# Patient Record
Sex: Male | Born: 2005 | Race: White | Hispanic: No | Marital: Single | Smoking: Never smoker
Health system: Southern US, Community
[De-identification: ages and names within clinical notes are randomized; demographics above are authoritative.]

## PROBLEM LIST (undated history)

## (undated) DIAGNOSIS — H6690 Otitis media, unspecified, unspecified ear: Secondary | ICD-10-CM

## (undated) DIAGNOSIS — B2790 Infectious mononucleosis, unspecified without complication: Secondary | ICD-10-CM

## (undated) HISTORY — PX: CIRCUMCISION REVISION: SHX1347

---
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
## 2024-02-15 ENCOUNTER — Ambulatory Visit
Admission: EM | Admit: 2024-02-15 | Discharge: 2024-02-15 | Disposition: A | Discharge status: Home / Self Care | Attending: Nurse Practitioner | Admitting: Nurse Practitioner

## 2024-02-15 DIAGNOSIS — J069 Acute upper respiratory infection, unspecified: Secondary | ICD-10-CM | POA: Diagnosis not present

## 2024-02-15 DIAGNOSIS — R059 Cough, unspecified: Secondary | ICD-10-CM

## 2024-02-15 MED ORDER — PSEUDOEPH-BROMPHEN-DM 30-2-10 MG/5ML PO SYRP: 5.0000 mL | ORAL_SOLUTION | Freq: Four times a day (QID) | ORAL | 0 refills | Status: DC | PRN

## 2024-02-15 MED ORDER — AZITHROMYCIN 250 MG PO TABS: 250.0000 mg | ORAL_TABLET | Freq: Every day | ORAL | 0 refills | Status: DC

## 2024-02-15 NOTE — ED Triage Notes (Signed)
 Pt reports he has a cough, sore throat, wheezing , and SHOB  x 2 weeks   Used inhaler which gave slight relief

## 2024-02-15 NOTE — ED Provider Notes (Signed)
 [facility]    CSN: [id number] Arrival date & time: 02/15/24  7829      History   Chief Complaint No chief complaint on file.   HPI Christopher Holloway is a 18 y.o. male.   The history is provided by the patient.   Patient presents with a 2-week history of cough, sore throat, wheezing, and shortness of breath.  Patient denies fever, chills, ear pain, difficulty breathing, chest pain, abdominal pain, nausea, vomiting, diarrhea, or rash.  Patient denies history of asthma or seasonal allergies, but states that he has been using an inhaler that was given to him by his mother.  He also states he has been taking over-the-counter antihistamines and cough medication with minimal relief of his symptoms.  Past Medical History:  Diagnosis Date   Mononucleosis    Otitis media    current ear infection, will finish antibiotic 10/04/11    There are no active problems to display for this patient.   Past Surgical History:  Procedure Laterality Date   ADENOIDECTOMY W/ MYRINGOTOMY  06/2007   CIRCUMCISION REVISION  age 44 yr.   MYRINGOTOMY  10/06/2011   Procedure: MYRINGOTOMY;  Surgeon: Carolan Shiver, MD;  Location: [facility];  Service: ENT;  Laterality: Bilateral;       Home Medications    Prior to Admission medications   Medication Sig Start Date End Date Taking? Authorizing Provider  albuterol (VENTOLIN HFA) 108 (90 Base) MCG/ACT inhaler INHALE 2 PUFFS BY MOUTH EVERY FOUR HOURS AS NEEDED 02/14/21 02/14/22  Bonnye Fava, FNP  albuterol (VENTOLIN HFA) 108 (90 Base) MCG/ACT inhaler Inhale 2 puffs by mouth every 4-6 hours as needed. 10/16/22     Clindamycin-Benzoyl Per, Refr, gel Apply topically to face daily at bedtime 08/22/21   Nita Sells, MD  doxycycline (VIBRAMYCIN) 100 MG capsule Take 1 capsule (100 mg total) by mouth 2 (two) times daily with food and water. 08/22/21   Nita Sells, MD  HYDROcodone-acetaminophen (NORCO/VICODIN) 5-325 MG tablet Take 1 tablet BY  MOUTH every 4-6 hours as needed for pain for 5 days. Patient not taking: Reported on 11/07/2021 03/21/21     ondansetron (ZOFRAN) 4 MG tablet Take 1 tablet (4 mg total) by mouth 3 (three) times daily as needed. 10/16/22     polyethylene glycol (MIRALAX / GLYCOLAX) packet Take 17 g by mouth daily.    [provider]    Family History Family History  Problem Relation Age of Onset   Asthma Sister        3 sisters have asthma   Asthma Sister    Asthma Sister    Diabetes Maternal Grandmother    Diabetes Paternal Grandfather    Hepatitis Paternal Grandfather        Hep. C   Anesthesia problems Mother        post-op N/V   Anesthesia problems Father        post-op N/V    Social History Social History   Tobacco Use   Smoking status: Never   Smokeless tobacco: Never     Allergies   Patient has no known allergies.   Review of Systems Review of Systems Per HPI  Physical Exam Triage Vital Signs ED Triage Vitals  Encounter Vitals Group     BP 02/15/24 0949 90/63     Systolic BP Percentile --      Diastolic BP Percentile --      Pulse Rate 02/15/24 0949 65  Resp 02/15/24 0949 16     Temp 02/15/24 0949 98.1 F (36.7 C)     Temp Source 02/15/24 0949 Oral     SpO2 02/15/24 0949 97 %     Weight --      Height --      Head Circumference --      Peak Flow --      Pain Score 02/15/24 0950 0     Pain Loc --      Pain Education --      Exclude from Growth Chart --    No data found.  Updated Vital Signs BP 90/63 (BP Location: Right Arm)   Pulse 65   Temp 98.1 F (36.7 C) (Oral)   Resp 16   SpO2 97%   Visual Acuity Right Eye Distance:   Left Eye Distance:   Bilateral Distance:    Right Eye Near:   Left Eye Near:    Bilateral Near:     Physical Exam Vitals and nursing note reviewed.  Constitutional:      General: He is not in acute distress.    Appearance: Normal appearance.  HENT:     Head: Normocephalic.     Right Ear: Tympanic membrane, ear  canal and external ear normal.     Left Ear: Tympanic membrane, ear canal and external ear normal.     Nose: Congestion present.     Mouth/Throat:     Lips: Pink.     Mouth: Mucous membranes are moist.     Pharynx: Posterior oropharyngeal erythema and postnasal drip present. No pharyngeal swelling, oropharyngeal exudate or uvula swelling.     Comments: Cobblestoning present to posterior oropharynx  Eyes:     General: Lids are normal.     Extraocular Movements: Extraocular movements intact.     Conjunctiva/sclera: Conjunctivae normal.     Pupils: Pupils are equal, round, and reactive to light.  Cardiovascular:     Rate and Rhythm: Normal rate and regular rhythm.     Pulses: Normal pulses.     Heart sounds: Normal heart sounds.  Pulmonary:     Effort: Pulmonary effort is normal. No respiratory distress.     Breath sounds: Normal breath sounds. No stridor. No wheezing, rhonchi or rales.  Abdominal:     General: Bowel sounds are normal.     Palpations: Abdomen is soft.     Tenderness: There is no abdominal tenderness.  Musculoskeletal:     Cervical back: Normal range of motion.  Lymphadenopathy:     Cervical: No cervical adenopathy.  Skin:    General: Skin is warm and dry.  Neurological:     General: No focal deficit present.     Mental Status: He is alert and oriented to person, place, and time.  Psychiatric:        Mood and Affect: Mood normal.        Behavior: Behavior normal.      UC Treatments / Results  Labs (all labs ordered are listed, but only abnormal results are displayed) Labs Reviewed - No data to display  EKG   Radiology No results found.  Procedures Procedures (including critical care time)  Medications Ordered in UC Medications - No data to display  Initial Impression / Assessment and Plan / UC Course  I have reviewed the triage vital signs and the nursing notes.  Pertinent labs & imaging results that were available during my care of the patient  were reviewed by me and  considered in my medical decision making (see chart for details).  On exam, lung sounds are clear throughout, room air sats at 97%.  Given the ongoing cough and chest congestion, will treat with azithromycin 250 mg.  For his cough, Bromfed-DM was prescribed.  Patient advised to continue over-the-counter antihistamines and use of his albuterol inhaler as needed.  Supportive care recommendations were provided and discussed with the patient to include fluids, rest, over-the-counter analgesics, and use of a humidifier during sleep.  Discussed indications with patient regarding follow-up.  Patient was in agreement with this plan of care and verbalizes understanding.  All questions were answered.  Patient stable for discharge.   Final Clinical Impressions(s) / UC Diagnoses   Final diagnoses:  None   Discharge Instructions   None    ED Prescriptions   None    PDMP not reviewed this encounter.   Abran Cantor, NP 02/15/24 1029

## 2024-02-15 NOTE — Discharge Instructions (Signed)
 Take medication as prescribed. Increase fluids and allow for plenty of rest. Continue use of your albuterol inhaler and current allergy medications at this time. May take over-the-counter Tylenol as needed for pain, fever, or general discomfort. Also recommend the use of a humidifier in your bedroom at nighttime during sleep and sleeping elevated on pillows while symptoms persist. Warm salt water gargles 3-4 times daily while symptoms persist. As discussed, if symptoms fail to improve, you may follow-up in this clinic or with your primary care physician for further evaluation. Follow-up as needed.

## 2024-10-15 ENCOUNTER — Other Ambulatory Visit: Payer: Self-pay

## 2024-10-15 ENCOUNTER — Telehealth: Admitting: Physician Assistant

## 2024-10-15 DIAGNOSIS — B9689 Other specified bacterial agents as the cause of diseases classified elsewhere: Secondary | ICD-10-CM | POA: Diagnosis not present

## 2024-10-15 DIAGNOSIS — J069 Acute upper respiratory infection, unspecified: Secondary | ICD-10-CM | POA: Diagnosis not present

## 2024-10-15 MED ORDER — AMOXICILLIN-POT CLAVULANATE 875-125 MG PO TABS
1.0000 | ORAL_TABLET | Freq: Two times a day (BID) | ORAL | 0 refills | Status: AC
  Filled 2024-10-15: qty 14, 7d supply, fill #0

## 2024-10-15 MED ORDER — BENZONATATE 100 MG PO CAPS
100.0000 mg | ORAL_CAPSULE | Freq: Three times a day (TID) | ORAL | 0 refills | Status: AC | PRN
  Filled 2024-10-15: qty 30, 10d supply, fill #0

## 2024-10-15 MED ORDER — ALBUTEROL SULFATE HFA 108 (90 BASE) MCG/ACT IN AERS
2.0000 | INHALATION_SPRAY | Freq: Four times a day (QID) | RESPIRATORY_TRACT | 0 refills | Status: AC | PRN
  Filled 2024-10-15: qty 8.5, 30d supply, fill #0

## 2024-10-15 NOTE — Patient Instructions (Signed)
 Christopher Holloway, thank you for joining Christopher Velma Lunger, PA-C for today's virtual visit.  While this provider is not your primary care provider (PCP), if your PCP is located in our provider database this encounter information will be shared with them immediately following your visit.   A [facility] MyChart account gives you access to today's visit and all your visits, tests, and labs performed at [facility]  click here if you don't have a  MyChart account or go to [email or web address]  Consent: (Patient) Christopher Holloway provided verbal consent for this virtual visit at the beginning of the encounter.  Current Medications:  Current Outpatient Medications:    albuterol  (VENTOLIN  HFA) 108 (90 Base) MCG/ACT inhaler, INHALE 2 PUFFS BY MOUTH EVERY FOUR HOURS AS NEEDED, Disp: 18 g, Rfl: 1   albuterol  (VENTOLIN  HFA) 108 (90 Base) MCG/ACT inhaler, Inhale 2 puffs by mouth every 4-6 hours as needed., Disp: 6.7 g, Rfl: 1   azithromycin  (ZITHROMAX ) 250 MG tablet, Take 1 tablet (250 mg total) by mouth daily. Take first 2 tablets together, then 1 every day until finished., Disp: 6 tablet, Rfl: 0   brompheniramine-pseudoephedrine-DM 30-2-10 MG/5ML syrup, Take 5 mLs by mouth 4 (four) times daily as needed., Disp: 140 mL, Rfl: 0   Clindamycin -Benzoyl Per, Refr, gel, Apply topically to face daily at bedtime, Disp: 45 g, Rfl: 11   doxycycline  (VIBRAMYCIN ) 100 MG capsule, Take 1 capsule (100 mg total) by mouth 2 (two) times daily with food and water., Disp: 60 capsule, Rfl: 3   HYDROcodone -acetaminophen  (NORCO/VICODIN) 5-325 MG tablet, Take 1 tablet BY MOUTH every 4-6 hours as needed for pain for 5 days. (Patient not taking: Reported on 11/07/2021), Disp: 40 tablet, Rfl: 0   ondansetron  (ZOFRAN ) 4 MG tablet, Take 1 tablet (4 mg total) by mouth 3 (three) times daily as needed., Disp: 10 tablet, Rfl: 0   polyethylene glycol (MIRALAX / GLYCOLAX) packet, Take 17 g by mouth daily.,  Disp: , Rfl:    Medications ordered in this encounter:  No orders of the defined types were placed in this encounter.    *If you need refills on other medications prior to your next appointment, please contact your pharmacy*  Follow-Up: Call back or seek an in-person evaluation if the symptoms worsen or if the condition fails to improve as anticipated.  [facility] [phone]  Other Instructions Take antibiotic (Augmentin ) as directed.  Increase fluids.  Get plenty of rest. Use Mucinex for congestion. Take other prescribed medications as directed. Take a daily probiotic (I recommend Align or Culturelle, but even Activia Yogurt may be beneficial).  A humidifier placed in the bedroom may offer some relief for a dry, scratchy throat of nasal irritation.  Read information below on acute bronchitis. If you note any non-resolving, new, or worsening symptoms despite treatment, please seek an in-person evaluation ASAP.   Acute Bronchitis Bronchitis is when the airways that extend from the windpipe into the lungs get red, puffy, and painful (inflamed). Bronchitis often causes thick spit (mucus) to develop. This leads to a cough. A cough is the most common symptom of bronchitis. In acute bronchitis, the condition usually begins suddenly and goes away over time (usually in 2 weeks). Smoking, allergies, and asthma can make bronchitis worse. Repeated episodes of bronchitis may cause more lung problems.  HOME CARE Rest. Drink enough fluids to keep your pee (urine) clear or pale yellow (unless you need to limit fluids as told by your doctor).  Only take over-the-counter or prescription medicines as told by your doctor. Avoid smoking and secondhand smoke. These can make bronchitis worse. If you are a smoker, think about using nicotine gum or skin patches. Quitting smoking will help your lungs heal faster. Reduce the chance of getting bronchitis again by: Washing your hands often. Avoiding  people with cold symptoms. Trying not to touch your hands to your mouth, nose, or eyes. Follow up with your doctor as told.  GET HELP IF: Your symptoms do not improve after 1 week of treatment. Symptoms include: Cough. Fever. Coughing up thick spit. Body aches. Chest congestion. Chills. Shortness of breath. Sore throat.  GET HELP RIGHT AWAY IF:  You have an increased fever. You have chills. You have severe shortness of breath. You have bloody thick spit (sputum). You throw up (vomit) often. You lose too much body fluid (dehydration). You have a severe headache. You faint.  MAKE SURE YOU:  Understand these instructions. Will watch your condition. Will get help right away if you are not doing well or get worse. Document Released: 04/24/2008 Document Revised: 07/09/2013 Document Reviewed: 04/29/2013 [facility] Patient Information 2015 [place], [place]. This information is not intended to replace advice given to you by your health care provider. Make sure you discuss any questions you have with your health care provider.    If you have been instructed to have an in-person evaluation today at a local Urgent Care facility, please use the link below. It will take you to a list of all of our available [facility], including address, phone number and hours of operation. Please do not delay care.  [facility]  If you or a family member do not have a primary care provider, use the link below to schedule a visit and establish care. When you choose a [facility] primary care physician or advanced practice provider, you gain a long-term partner in health. Find a Primary Care Provider  Learn more about [facility] in-office and virtual care options: [facility] - Get Care Now

## 2024-10-15 NOTE — Progress Notes (Signed)
 Virtual Visit Consent   Christopher Holloway, you are scheduled for a virtual visit with a [facility] provider today. Just as with appointments in the office, your consent must be obtained to participate. Your consent will be active for this visit and any virtual visit you may have with one of our providers in the next 365 days. If you have a MyChart account, a copy of this consent can be sent to you electronically.  As this is a virtual visit, video technology does not allow for your provider to perform a traditional examination. This may limit your provider's ability to fully assess your condition. If your provider identifies any concerns that need to be evaluated in person or the need to arrange testing (such as labs, EKG, etc.), we will make arrangements to do so. Although advances in technology are sophisticated, we cannot ensure that it will always work on either your end or our end. If the connection with a video visit is poor, the visit may have to be switched to a telephone visit. With either a video or telephone visit, we are not always able to ensure that we have a secure connection.  By engaging in this virtual visit, you consent to the provision of healthcare and authorize for your insurance to be billed (if applicable) for the services provided during this visit. Depending on your insurance coverage, you may receive a charge related to this service.  I need to obtain your verbal consent now. Are you willing to proceed with your visit today? Christopher Holloway has provided verbal consent on 10/15/2024 for a virtual visit (video or telephone). Christopher Holloway, [place]  Date: 10/15/2024 2:33 PM   Virtual Visit via Video Note   I, Christopher Holloway, connected with  Christopher Holloway  ([id number], [birth year]) on 10/15/24 at  2:30 PM EST by a video-enabled telemedicine application and verified that I am speaking with the correct person using two identifiers.  Location: Patient: Virtual  Visit Location Patient: Home Provider: Virtual Visit Location Provider: Home Office   I discussed the limitations of evaluation and management by telemedicine and the availability of in person appointments. The patient expressed understanding and agreed to proceed.    History of Present Illness: Christopher Holloway is a 18 y.o. who identifies as a male who was assigned male at birth, and is being seen today for 2 weeks of continued and progressive URI symptoms starting with mild chest congestion and cough that has worsened and become persistent. Notes cough waking him up throughout the night. Some windedness with exertion only -- working out.  Over past week with substantial nasal congestion and drainage along with sinus pain. Denies fever. Denies chest pain.  OTC -- Flonase, Zyrtec-D, Tylenol   HPI: HPI  Problems:  Patient Active Problem List   Diagnosis Date Noted   Concussion without loss of consciousness 02/09/2022   Fracture of metacarpal bone 03/21/2021    Allergies: No Known Allergies Medications:  Current Outpatient Medications:    albuterol  (VENTOLIN  HFA) 108 (90 Base) MCG/ACT inhaler, Inhale 2 puffs into the lungs every 6 (six) hours as needed for wheezing or shortness of breath., Disp: 8.5 g, Rfl: 0   amoxicillin -clavulanate (AUGMENTIN ) 875-125 MG tablet, Take 1 tablet by mouth 2 (two) times daily., Disp: 14 tablet, Rfl: 0   benzonatate  (TESSALON ) 100 MG capsule, Take 1 capsule (100 mg total) by mouth 3 (three) times daily as needed for cough., Disp: 30 capsule, Rfl: 0  Observations/Objective: Patient is  well-developed, well-nourished in no acute distress.  Resting comfortably at home.  Head is normocephalic, atraumatic.  No labored breathing.  Speech is clear and coherent with logical content.  Patient is alert and oriented at baseline.   Assessment and Plan: 1. Bacterial URI (Primary) - benzonatate  (TESSALON ) 100 MG capsule; Take 1 capsule (100 mg total) by mouth 3  (three) times daily as needed for cough.  Dispense: 30 capsule; Refill: 0 - albuterol  (VENTOLIN  HFA) 108 (90 Base) MCG/ACT inhaler; Inhale 2 puffs into the lungs every 6 (six) hours as needed for wheezing or shortness of breath.  Dispense: 8.5 g; Refill: 0 - amoxicillin -clavulanate (AUGMENTIN ) 875-125 MG tablet; Take 1 tablet by mouth 2 (two) times daily.  Dispense: 14 tablet; Refill: 0  Rx Augmentin .  Increase fluids.  Rest.  Saline nasal spray.  Probiotic.  Mucinex as directed.  Humidifier in bedroom. Tessalon  and Albuterol  per orders.  Call or return to clinic if symptoms are not improving.   Follow Up Instructions: I discussed the assessment and treatment plan with the patient. The patient was provided an opportunity to ask questions and all were answered. The patient agreed with the plan and demonstrated an understanding of the instructions.  A copy of instructions were sent to the patient via MyChart unless otherwise noted below.   The patient was advised to call back or seek an in-person evaluation if the symptoms worsen or if the condition fails to improve as anticipated.    Christopher Velma Lunger, PA-C
